# Patient Record
Sex: Male | Born: 1975 | Race: White | Hispanic: No | Marital: Married
Health system: Southern US, Community
[De-identification: ages and names within clinical notes are randomized; demographics above are authoritative.]

---

## 2012-10-04 ENCOUNTER — Inpatient Hospital Stay: Payer: Self-pay | Admitting: Student

## 2012-10-04 LAB — LIPASE, BLOOD: Lipase: >3000 U/L (ref 73–393)

## 2012-10-04 LAB — URINALYSIS, COMPLETE
Bacteria: NONE SEEN
Ketone: NEGATIVE
Leukocyte Esterase: NEGATIVE
Nitrite: NEGATIVE
Ph: 5 (ref 4.5–8.0)
Protein: NEGATIVE
Specific Gravity: 1.014 (ref 1.003–1.030)
Squamous Epithelial: <1

## 2012-10-04 LAB — COMPREHENSIVE METABOLIC PANEL
Bilirubin,Total: 0.3 mg/dL (ref 0.2–1.0)
Co2: 26 mmol/L (ref 21–32)
Creatinine: 1.05 mg/dL (ref 0.60–1.30)
EGFR (African American): >60
EGFR (Non-African Amer.): >60
Glucose: 131 mg/dL — ABNORMAL HIGH (ref 65–99)
Potassium: 3.2 mmol/L — ABNORMAL LOW (ref 3.5–5.1)
Sodium: 140 mmol/L (ref 136–145)

## 2012-10-04 LAB — CBC
HGB: 14.8 g/dL (ref 13.0–18.0)
MCH: 32.6 pg (ref 26.0–34.0)
MCV: 93 fL (ref 80–100)
RBC: 4.55 10*6/uL (ref 4.40–5.90)

## 2012-10-04 LAB — HEPATIC FUNCTION PANEL A (ARMC)
Albumin: 4 g/dL (ref 3.4–5.0)
Alkaline Phosphatase: 64 U/L (ref 50–136)
Bilirubin, Direct: 0.2 mg/dL (ref 0.00–0.20)
Bilirubin,Total: 0.7 mg/dL (ref 0.2–1.0)
Total Protein: 7.5 g/dL (ref 6.4–8.2)

## 2012-10-04 LAB — PROTIME-INR: INR: 1

## 2012-10-05 LAB — COMPREHENSIVE METABOLIC PANEL
Albumin: 3.2 g/dL — ABNORMAL LOW (ref 3.4–5.0)
Alkaline Phosphatase: 50 U/L (ref 50–136)
Bilirubin,Total: 1 mg/dL (ref 0.2–1.0)
Co2: 27 mmol/L (ref 21–32)
Creatinine: 1.06 mg/dL (ref 0.60–1.30)
EGFR (Non-African Amer.): >60
Glucose: 114 mg/dL — ABNORMAL HIGH (ref 65–99)
Osmolality: 276 (ref 275–301)
SGPT (ALT): 72 U/L (ref 12–78)
Total Protein: 6.4 g/dL (ref 6.4–8.2)

## 2012-10-05 LAB — CBC WITH DIFFERENTIAL/PLATELET
Basophil #: 0.1 10*3/uL (ref 0.0–0.1)
Eosinophil #: 0 10*3/uL (ref 0.0–0.7)
Eosinophil %: 0 %
Lymphocyte #: 0.6 10*3/uL — ABNORMAL LOW (ref 1.0–3.6)
Monocyte %: 3.5 %
Neutrophil #: 11.8 10*3/uL — ABNORMAL HIGH (ref 1.4–6.5)
Neutrophil %: 90.9 %
WBC: 13 10*3/uL — ABNORMAL HIGH (ref 3.8–10.6)

## 2012-10-05 LAB — MAGNESIUM: Magnesium: 1.6 mg/dL — ABNORMAL LOW

## 2012-10-05 LAB — LIPASE, BLOOD: Lipase: 2529 U/L — ABNORMAL HIGH (ref 73–393)

## 2012-10-06 LAB — CBC WITH DIFFERENTIAL/PLATELET
Basophil #: 0 10*3/uL (ref 0.0–0.1)
Eosinophil %: 0.3 %
HCT: 39.5 % — ABNORMAL LOW (ref 40.0–52.0)
Lymphocyte #: 1.4 10*3/uL (ref 1.0–3.6)
Lymphocyte %: 11.7 %
MCV: 94 fL (ref 80–100)
Monocyte #: 0.7 x10 3/mm (ref 0.2–1.0)
Monocyte %: 5.4 %
Neutrophil #: 10.1 10*3/uL — ABNORMAL HIGH (ref 1.4–6.5)
Neutrophil %: 82.2 %
Platelet: 185 10*3/uL (ref 150–440)
RBC: 4.19 10*6/uL — ABNORMAL LOW (ref 4.40–5.90)
RDW: 13.5 % (ref 11.5–14.5)
WBC: 12.3 10*3/uL — ABNORMAL HIGH (ref 3.8–10.6)

## 2012-10-06 LAB — BASIC METABOLIC PANEL
BUN: 14 mg/dL (ref 7–18)
Calcium, Total: 7.4 mg/dL — ABNORMAL LOW (ref 8.5–10.1)
Creatinine: 1.05 mg/dL (ref 0.60–1.30)
EGFR (Non-African Amer.): >60
Sodium: 138 mmol/L (ref 136–145)

## 2012-10-07 LAB — BASIC METABOLIC PANEL
Anion Gap: 8 (ref 7–16)
BUN: 10 mg/dL (ref 7–18)
Calcium, Total: 7.6 mg/dL — ABNORMAL LOW (ref 8.5–10.1)
Co2: 23 mmol/L (ref 21–32)
Creatinine: 0.82 mg/dL (ref 0.60–1.30)
EGFR (African American): >60
EGFR (Non-African Amer.): >60
Osmolality: 276 (ref 275–301)
Potassium: 3.7 mmol/L (ref 3.5–5.1)
Sodium: 139 mmol/L (ref 136–145)

## 2012-10-07 LAB — CBC WITH DIFFERENTIAL/PLATELET
Basophil %: 0.3 %
Eosinophil %: 1.7 %
HCT: 35 % — ABNORMAL LOW (ref 40.0–52.0)
HGB: 12.4 g/dL — ABNORMAL LOW (ref 13.0–18.0)
Lymphocyte %: 9.7 %
MCH: 33.2 pg (ref 26.0–34.0)
MCV: 93 fL (ref 80–100)
Monocyte #: 1.1 x10 3/mm — ABNORMAL HIGH (ref 0.2–1.0)
Platelet: 176 10*3/uL (ref 150–440)

## 2012-10-08 LAB — BASIC METABOLIC PANEL
Anion Gap: 7 (ref 7–16)
BUN: 8 mg/dL (ref 7–18)
Calcium, Total: 8.3 mg/dL — ABNORMAL LOW (ref 8.5–10.1)
Chloride: 107 mmol/L (ref 98–107)
Co2: 24 mmol/L (ref 21–32)
Creatinine: 0.85 mg/dL (ref 0.60–1.30)
EGFR (African American): >60
EGFR (Non-African Amer.): >60
Glucose: 82 mg/dL (ref 65–99)
Osmolality: 273 (ref 275–301)
Potassium: 3.8 mmol/L (ref 3.5–5.1)
Sodium: 138 mmol/L (ref 136–145)

## 2012-10-11 LAB — CULTURE, BLOOD (SINGLE)

## 2013-10-18 IMAGING — CT CT ABD-PELV W/O CM
1 of 2 series · 15 of 32 positions shown, 19 images · non-contrast
Comparison: None

REASON FOR EXAM: (1) abd pain vomiting; (2) abd pain vomiting
COMMENTS:

PROCEDURE:     CT  - CT ABDOMEN AND PELVIS W[DATE]  [DATE]
RESULT:     Indication: Abdominal pain and vomiting
TECHNIQUE: Multiple axial images from the lung bases to the symphysis pubis
were obtained without oral and without intravenous contrast.

[Series 2: 3mm soft tissue · axial · 0.74mm/px · z∈[-1036,-526]mm · 15 of 186 slices shown, 19 images]
[im 8/186  soft-tissue]
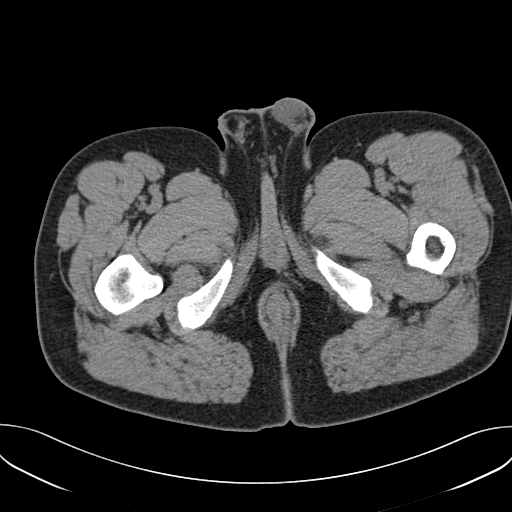
[im 8/186  bone]
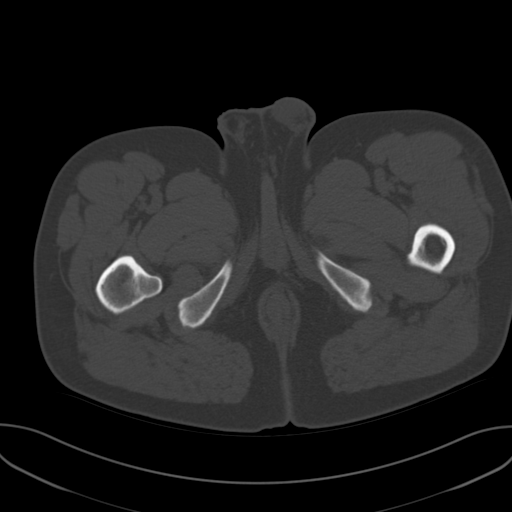
[im 24/186  soft-tissue]
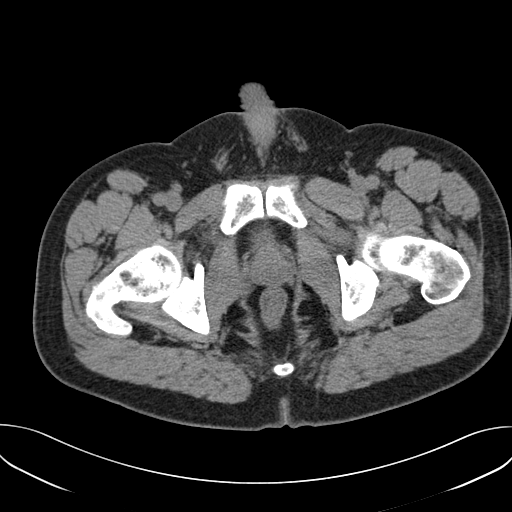
[im 39/186  soft-tissue]
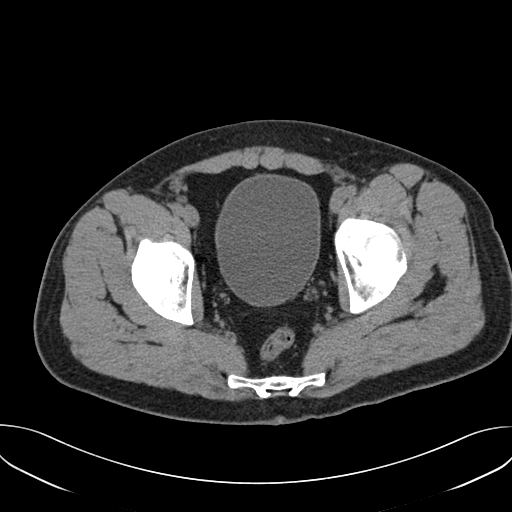
[im 54/186  soft-tissue]
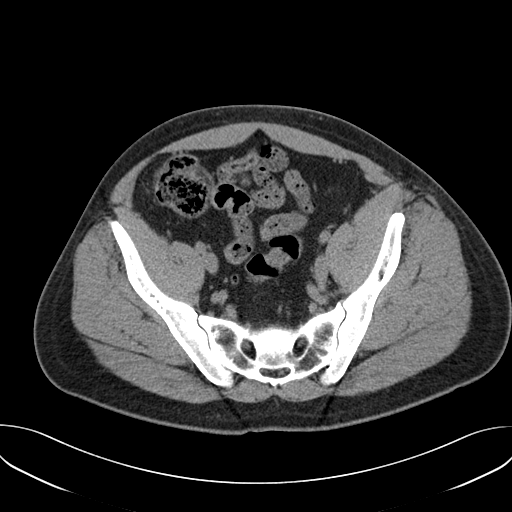
[im 62/186  soft-tissue]
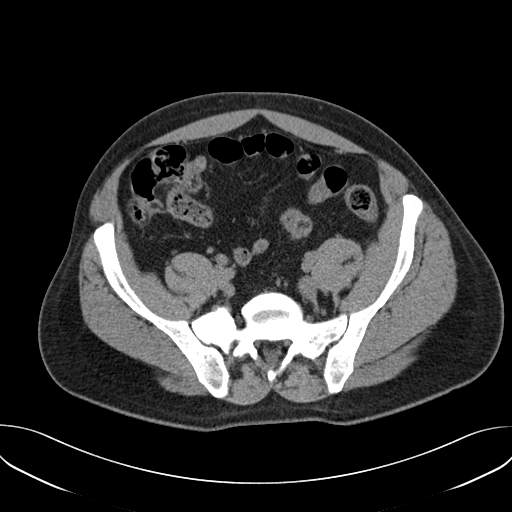
[im 78/186  soft-tissue]
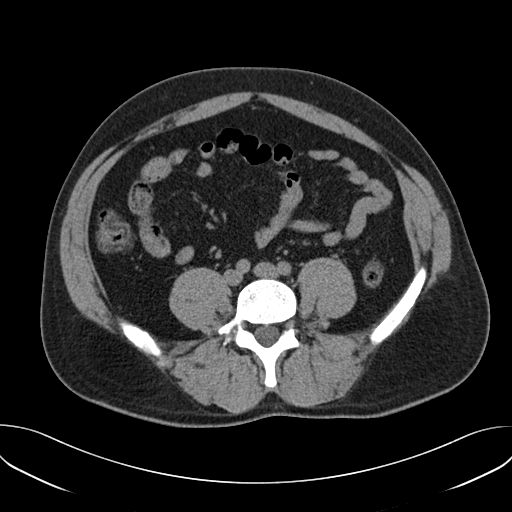
[im 93/186  soft-tissue]
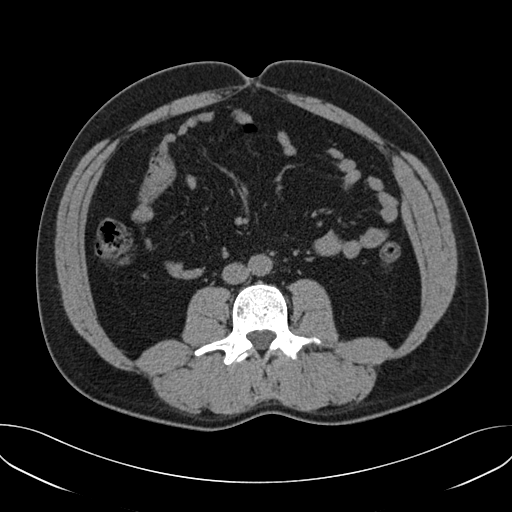
[im 108/186  soft-tissue]
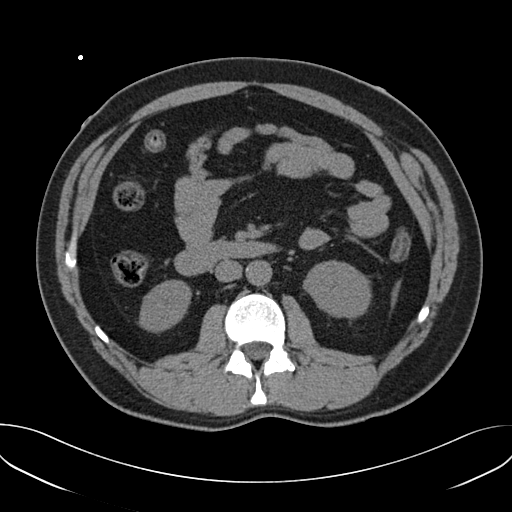
[im 124/186  soft-tissue]
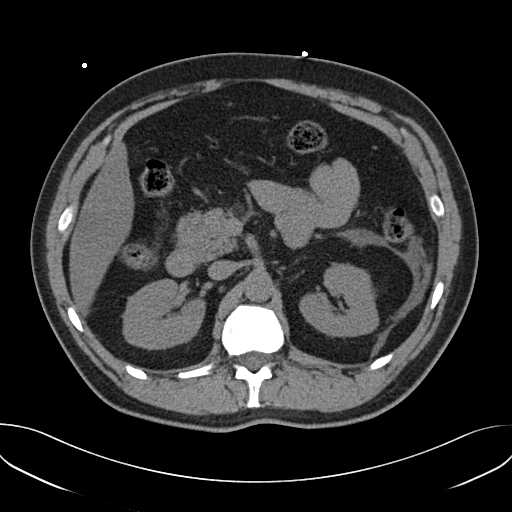
[im 124/186  bone]
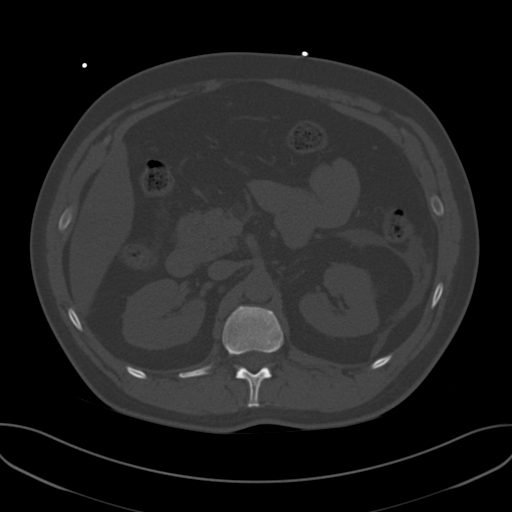
[im 132/186  soft-tissue]
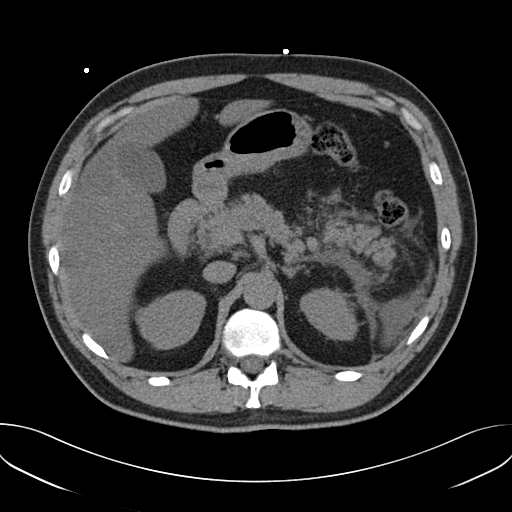
[im 147/186  soft-tissue]
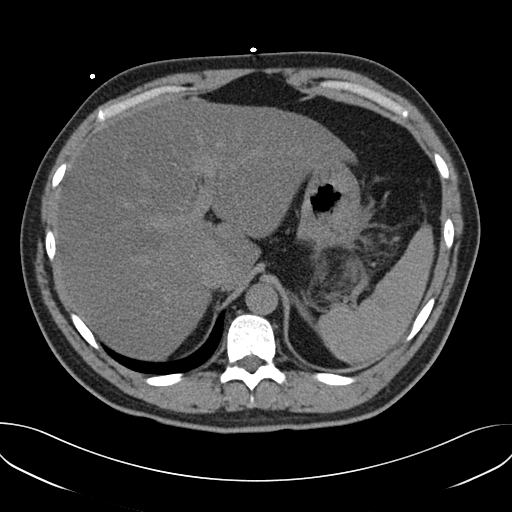
[im 155/186  lung]
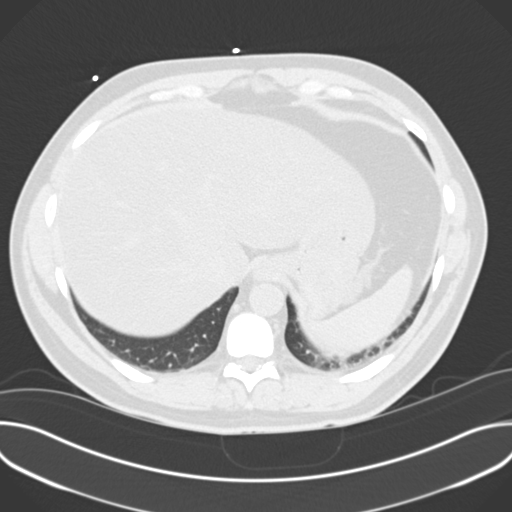
[im 162/186  soft-tissue]
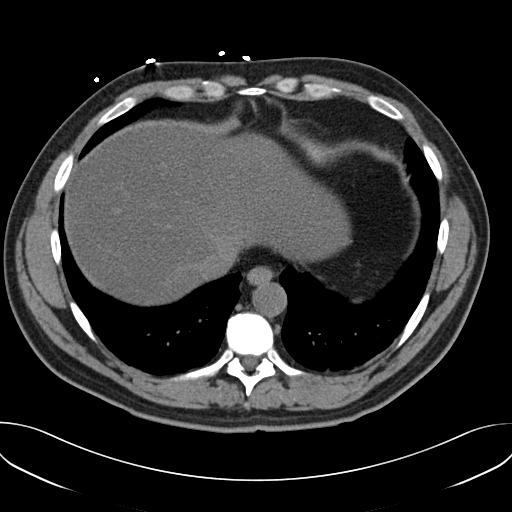
[im 162/186  lung]
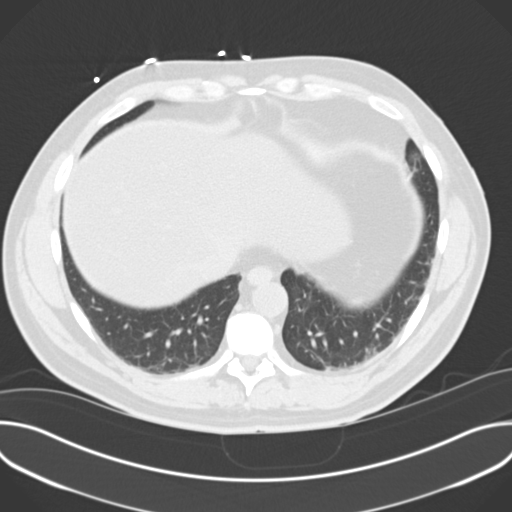
[im 170/186  lung]
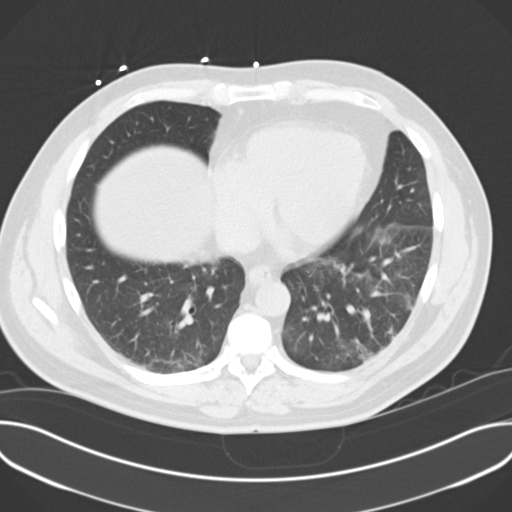
[im 178/186  soft-tissue]
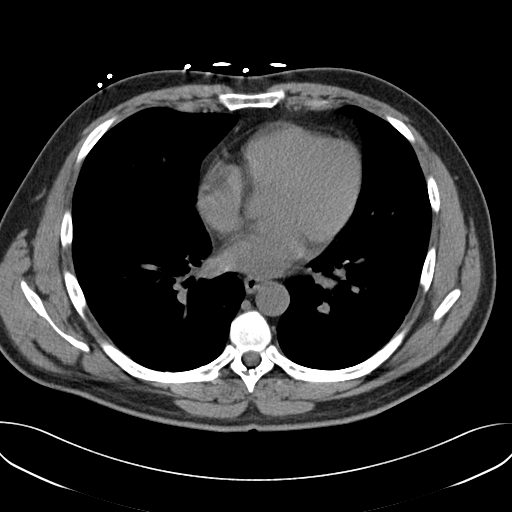
[im 178/186  lung]
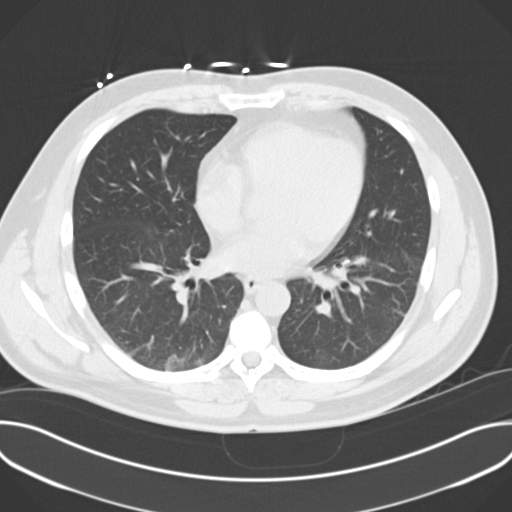

[15 of 32 positions shown; findings below may reference images not displayed]

FINDINGS: The lung bases are clear. There is no pleural or pericardial effusions.

No renal, ureteral, or bladder calculi. No obstructive uropathy. No
perinephric stranding is seen. The kidneys are symmetric in size without
evidence for exophytic mass. The bladder is unremarkable.

The liver is diffusely low in attenuation likely secondary to hepatic
steatosis. The gallbladder is unremarkable. The spleen demonstrates no focal
abnormality. The adrenal glands are normal.

There is peripancreatic inflammatory changes surrounding the pancreatic tail
with a small amount of fluid tracking along the left paracolic gutter. There
is no focal fluid collection.

The unopacified stomach, duodenum, small intestine, and large intestine are
unremarkable, but evaluation is limited by lack of oral contrast. There is a
normal caliber appendix in the right lower quadrant without periappendiceal
inflammatory changes. There is no pneumoperitoneum, pneumatosis, or portal
venous gas. There is no abdominal or pelvic free fluid. There is no
lymphadenopathy.

The abdominal aorta is normal in caliber with atherosclerosis.

The osseous structures are unremarkable.
IMPRESSION: 1. Peripancreatic inflammatory changes around the pancreatic tail most
concerning for acute pancreatitis. There is no focal fluid collection to
suggest a pseudocyst or abscess.

2. Hepatic steatosis.

[REDACTED]

## 2013-10-20 IMAGING — CR DG CHEST 1V PORT
1 series · 1 of 1 positions shown · non-contrast
Comparison: none

REASON FOR EXAM: Decreasing oxygen saturation, cough
COMMENTS:

PROCEDURE:     DXR - DXR PORTABLE CHEST SINGLE VIEW  - October 06, 2012  [DATE]
RESULT:     Comparison: None.

[ap]
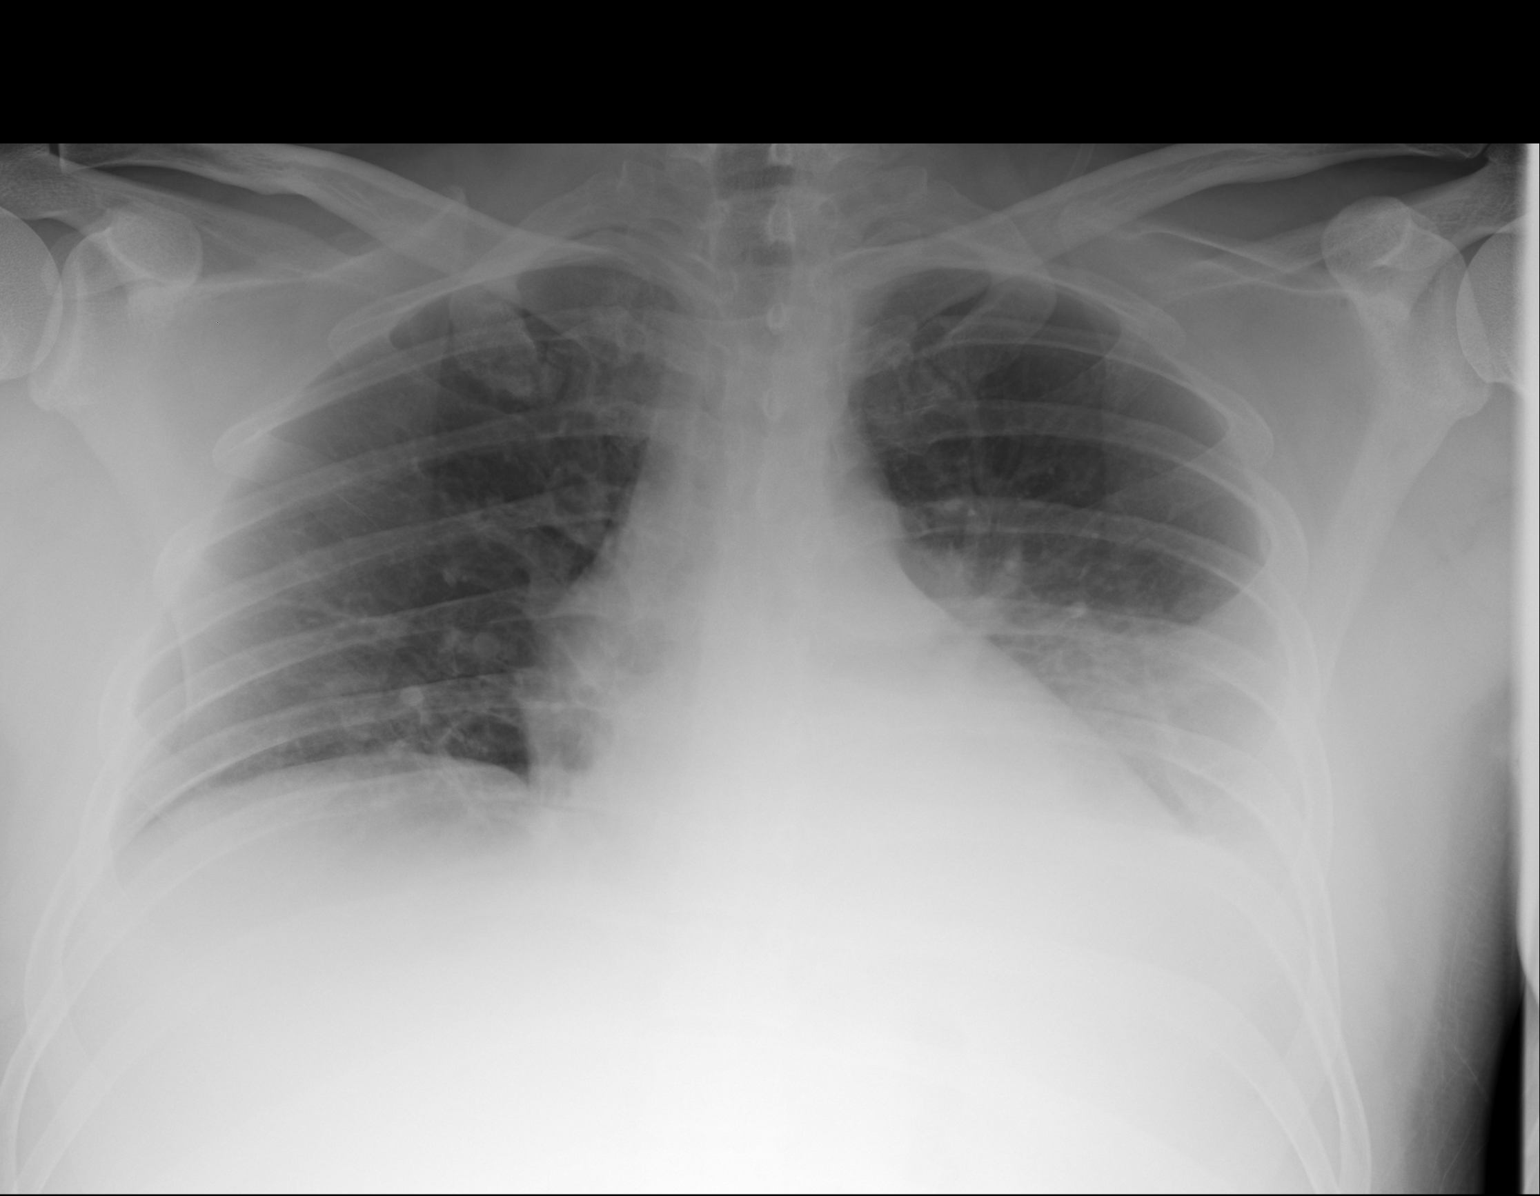

[1 of 1 positions shown; findings below may reference images not displayed]

FINDINGS: The lung volumes are low. Heart size upper limits normal, though this may be
secondary to low lung volumes. There is relative homogeneous opacity in the
left lower lung.
IMPRESSION: Homogeneous opacity in the left lower lung could be secondary to atelectasis
or infection/aspiration. Followup PA and lateral chest radiograph is
suggested when clinically able.

## 2014-09-04 NOTE — Discharge Summary (Signed)
PATIENT NAME:  Darren Richardson, Darren Richardson MR#:  [id number] DATE OF BIRTH:  [birth year]  DATE OF ADMISSION:  10/04/2012 DATE OF DISCHARGE:  10/10/2012  CHIEF COMPLAINT: Abdominal pain and vomiting.   PRIMARY CARE PHYSICIAN: Caroline More, MD  CONSULTANT: Dr. Marva Panda from GI.   DISCHARGE DIAGNOSES: 1.  Acute pancreatitis, likely alcohol induced. 2.  Acute respiratory failure secondary to pneumonia.  3.  Ileus, likely secondary to pancreatitis.  4.  Elevated liver function tests with enlarged liver.  5.  Alcohol abuse.  6.  Hypertension, noncompliance with medications.  7.  Hypomagnesemia.  8.  Hypokalemia.  DISCHARGE MEDICATIONS: 1.  Alprazolam 1 mg 1 tab 3 times a day as needed for anxiety. 2.  Amlodipine 5 mg daily. 3.  Ultram 50 mg every 6 hours as needed.   DIET: Low fat, low cholesterol, bland diet, advance slowly for a few weeks as tolerated.   ACTIVITY: As tolerated.   DISCHARGE FOLLOWUP: Please follow with PCP within 1 to 2 weeks. Please follow with Dr. Marva Panda from GI within 2 to 4 weeks.   DISPOSITION: Home.   SIGNIFICANT LABORATORY AND DIAGNOSTICS: Initial lipase greater than 3000.  Magnesium 1.3. Initial ethanol level was 0.09. Initial potassium 3.2, sodium 140, creatinine 1.05. LFTs: AST was 78, ALT 114. Initial troponin was negative. The last lipase from May 28th was 451.  Initial WBC 13.9.   Initial CT on May 23rd was positive for peripancreatic inflammatory changes around the pancreatic tail most concerning for acute pancreatitis and also hepatic steatosis.  CT of chest, abdomen and pelvis without contrast, on May 25th, showing moderate peripancreatic stranding is increased from prior. Small left and trace right pleural effusions, consolidation and volume loss in the left lower lobe and to a lesser extent in the right lower lobe likely secondary to atelectasis.   HISTORY OF PRESENT ILLNESS AND HOSPITAL COURSE: For full details of H and P, please see the dictation on May 23rd  by Dr. Randol Kern, but briefly this is a pleasant 39 year old male with alcohol abuse and hypertension with significant drinking on a nightly basis of 8 to 12 shots of bourbon who presents with acute pancreatitis. He had been vomiting, had blood tinged vomitus as well, and he was admitted to the hospitalist service for acute pancreatitis with increased lipase and CT showing acute pancreatitis as well. He was admitted to the hospitalist service with a GI consult. He was started on aggressive IV fluid resuscitation, pain meds as well as Zofran and opiates. He was initially kept n.p.o. His diet was advanced slowly. He did have very slow improvement. Pancreatitis was likely alcohol induced. He did develop ileus secondary to pancreatitis as well. He was started on CIWA protocol as well. He remained calm, stable without significant withdrawal symptoms. His blood pressure was labile and elevated, likely accelerated hypertension in the setting of noncompliant with medications, including pain from pancreatitis. His atenolol was changed to amlodipine. His blood pressure did come down. His lytes were repleted as needed. He did have acute respiratory failure secondary to pneumonia with possible atelectasis as well and was treated with Rocephin and azithromycin. The respiratory failure has resolved. He is off of oxygen at this point. He has tolerated advanced diet and he will be discharged to outpatient follow-up.   CODE STATUS: The patient is FULL CODE.   TOTAL TIME SPENT: 35 minutes.  ____________________________ Darren Eaton, MD sa:sb D: 10/11/2012 08:22:27 ET T: 10/11/2012 10:45:04 ET JOB#: [id number]  cc: Darren Eaton, MD, <Dictator> Caroline More,  MD Darren EatonSHAYIQ Encarnacion Scioneaux MD ELECTRONICALLY SIGNED 10/16/2012 22:14

## 2014-09-04 NOTE — Consult Note (Signed)
PATIENT NAME:  Darren Richardson, Darren Richardson MR#:  [id number] DATE OF BIRTH:  10-24-1975  DATE OF CONSULTATION:  10/04/2012  REFERRING PHYSICIAN:  Dr. Randol Kern  CONSULTING PHYSICIAN:  Keturah Barre, NP  REASON FOR CONSULTATION:  Evaluation of alcoholic pancreatitis.   HISTORY OF PRESENT ILLNESS: I appreciate consult for this 39 year old Caucasian man with history of heavy alcohol use for evaluation of pancreatitis. He states this is his first episode of pancreatitis. He states he is feeling better since admission with his pain medicines and rehydration and his pain is down to 8/10, from a 10/10. States no further vomiting and the nausea only happens when the pain is intense.  He is receiving IV rehydration and IV pain medications, proton pump inhibitors. He states he has no other GI complaints right now.  He states he has no history of other GI complaints. Does report a history of flu-like symptoms 3 or 4 weeks ago and treated this at home and seems to have gotten better. I do note some elevation of liver transaminases, but not in alcoholic pattern. States there is no family history of liver disease. History is negative for [employer] health care service, tattoos, piercings, dialysis, history of hepatitis, blood transfusions and IVDU.  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 CAT scan reveals some fatty infiltration of the liver and pancreatitis.   PAST MEDICAL HISTORY: HTN, alcohol abuse.   PAST SURGICAL HISTORY: None.   FAMILY HISTORY: Hypertension. Negative for pancreatic diseases, liver diseases, PUD, colorectal cancer, colon polyps.   SOCIAL HISTORY: He works as a Copywriter, advertising.  Lives at home with his wife and drinks up to 8 shots of bourbon every night. Quit smoking cigarettes in January.   ALLERGIES: No known drug allergies.   HOME MEDICATIONS:  Xanax p.r.n., atenolol but has not been taking this regularly.   REVIEW OF SYSTEMS:  Ten  systems reviewed. These were unremarkable other than what is written above.   LABORATORY DATA:  Most recent labs: Glucose 131, BUN 8, creatinine 1.05, sodium 140, potassium 3.2, GFR greater than 60, magnesium 1.3, calcium 9. Lipase greater than 3000, EtOH 0.09. Total protein 7.5, albumin 4.1, total bilirubin 0.3, ALT 69, AST 78, alkaline phosphatase 114.  WBC 13.9, hemoglobin has been stable with readings of 14.8 and 15.5, hematocrit 42.4, platelet count 372. Red cells are normocytic. CT of the abdomen and pelvis revealed fatty liver and pancreatitis.   PHYSICAL EXAMINATION: VITAL SIGNS: Most recent vital signs: Temperature 98.1, pulse 86, respiratory rate 20, blood pressure 145/98, oxygen saturation 95%.  His CIWA scale has been ranging from 3 to 4 today.   GENERAL: Pleasant Caucasian man resting in bed in no acute distress.   HEENT: Normocephalic, atraumatic. No scleral icterus. No redness, drainage, or erythema to the eyes, nares or mouth.   NECK: Supple. No thyromegaly or JVD.   PULMONARY: Respirations eupneic. Lungs CTAB.   CARDIAC: S1, S2, regular rate and rhythm. No MRG. No appreciable edema. Peripheral pulses 2+.   ABDOMEN: Mildly protuberant. Hypoactive bowel sounds x 4. Soft. Generalized discomfort, more so to the left upper quadrant. No guarding, rigidity, peritoneal signs. No Faythe Casa or Cullen sign.   EXTREMITIES: MAEW x 4. Sensation intact. No clubbing or cyanosis.   NEUROLOGIC: Alert, oriented x 3. Cranial nerves II through XII are grossly intact. Speech clear. No facial droop.   PSYCHIATRIC: Calm, appropriate, intact judgment.   SKIN: Warm, dry, pink. No erythema, lesion  or rash.   IMPRESSION AND PLAN: 1. Pancreatitis.  Strongly recommend alcohol abstinence. The patient and wife are agreeable and plan on this. Continue present with IV fluid rehydration and pain medications. Will repeat lipase. 2. Elevated liver function tests.  Some of this may be related to EtOH, but  this is not typically an alcoholic pattern. We will assess for hepatitis A, B, C and assess PT-INR and repeat liver panel, following with you.   These services were provided by Vevelyn Pathristiane London, MSN, [facility] in collaboration with Barnetta ChapelMartin Skulskie, M.D. with whom I have discussed this patient in full. Thank you very much for this consult    ____________________________ Keturah Barrehristiane H. London, NP   chl:rw D: 10/04/2012 15:31:27 ET T: 10/04/2012 16:58:21 ET JOB#: [id number]  cc: Keturah Barrehristiane H. London, NP, <Dictator> Eustaquio MaizeHRISTIANE H LONDON FNP ELECTRONICALLY SIGNED 10/30/2012 15:58

## 2014-09-04 NOTE — Consult Note (Signed)
Chief Complaint:  Subjective/Chief Complaint seen for etoh related pancreatitis.  continues with abdominla pain and bloating/distension, not passing flatus, pain is about the same as when seen last night.  One episode of emesis this am, but this is better, now no nausea.  Increased abdominal pain while walking   VITAL SIGNS/ANCILLARY NOTES: **Vital Signs.:   24-May-14 12:57  Vital Signs Type Routine  Temperature Temperature (F) 98.4  Celsius 36.8  Temperature Source oral  Pulse Pulse 116  Respirations Respirations 20  Systolic BP Systolic BP 062  Diastolic BP (mmHg) Diastolic BP (mmHg) 99  Mean BP 113  Pulse Ox % Pulse Ox % 91  Pulse Ox Activity Level  At rest  Oxygen Delivery Room Air/ 21 %  *Intake and Output.:   24-May-14 05:41  Grand Totals Intake:   Output:      Net:   24 Hr.:  3762    06:32  Grand Totals Intake:   Output:  300    Net:  -300 24 Hr.:  1129    Shift 07:00  Grand Totals Intake:   Output:  300    Net:  -300 24 Hr.:  8315  Length of Stay Totals Intake:  2379 Output:  1250    Net:  1129    Daily 07:00  Grand Totals Intake:  2379 Output:  1250    Net:  1761 60 Hr.:  7371  Length of Stay Totals Intake:  2379 Output:  1250    Net:  Theba Totals Intake:   Output:  1    Net:  -1 24 Hr.:  -1    12:57  Grand Totals Intake:   Output:      Net:   24 Hr.:  -1    Shift 15:00  Grand Totals Intake:   Output:  1    Net:  -1 24 Hr.:  -1  Length of Stay Totals Intake:  2379 Output:  0626    Net:  9485   Brief Assessment:  Cardiac Regular   Respiratory clear BS   Gastrointestinal details normal distended, slightoy less than yesterday, minimal bowel dsounds, moderate generalized tenderness, mostly left and upper abdomen.   Lab Results: Hepatic:  24-May-14 01:04   Bilirubin, Total 1.0  Alkaline Phosphatase 50  SGPT (ALT) 72  SGOT (AST)  42  Total Protein, Serum 6.4  Albumin, Serum  3.2  General Ref:  23-May-14 14:27    Hepatitis A Antibody, Total ========== TEST NAME ==========  ========= RESULTS =========  = REFERENCE RANGE =  HEPATITIS A ANTIBDY,TOTL  Hep A Ab, Total Hep A Ab, Total                 [   Negative             ]          Negative               [facility]   No: [id number]           [address] [place],  [address]           Lindon Romp, MD         [phone]   Result(s) reported on 05 Oct 2012 at 04:49AM.  Hepatitis B Surface Antibody, Qual ========== TEST NAME ==========  ========= RESULTS =========  = REFERENCE RANGE =  HEPATITIS B SURF.AB,QUAL  Hep B Surface Ab Hep B Surface Ab, Qual          [  Non Reactive         ]                                                Non Reactive: Inconsistent with immunity,                                            less than 10 mIU/mL                              Reactive:     Consistent with immunity,                                            greater than 9.9 mIU/mL               [facility]            No: [id number]           [address], [place], [place] [address]           Lindon Romp, MD         [phone]   Result(s) reported on 05 Oct 2012 at 04:49AM.  HBsAg ========== TEST NAME ==========  ========= RESULTS =========  = REFERENCE RANGE =  HEPATITIS B SURFACE AG  HBsAg Screen HBsAg Screen                    [   Negative             ]          Negative               [facility]            No: [id number]           [address], [place], [place] [address]           Lindon Romp, MD         [phone]   Result(s) reported on 05 Oct 2012 at 04:49AM.  Hepatitis C Virus Antibody ========== TEST NAME ==========  ========= RESULTS =========  = REFERENCE RANGE =  HCV ANTIBODY  HCV Antibody Hep C Virus Ab                  [   <0.1 s/co ratio      ]           0.0-0.9                                                  Negative:     < 0.8                                               Indeterminate 0.8 - 0.9  Positive:     > 0.9                                                                      .                  In order to reduce the incidence of a false positive                  result, the CDC recommends that all s/co ratios                  between 1.0 and 10.9 be confirmed by a more specific                  supplemental or PCR testing. [facility] offers HCV Ab                 w/Reflex to Verification test [phone].               [facility]            No: [id number]           [address] [place], [place] [address]           Lindon Romp, MD         [phone]   Result(s) reportedon 05 Oct 2012 at 04:49AM.  Routine Chem:  23-May-14 01:34   Creatinine (comp) 1.05  24-May-14 01:04   Lipase  > 3000 (Result(s) reported on 05 Oct 2012 at 01:33AM.)  Result Comment HGB - DUPLICATE ORDER. CBC ORDERED AS WELL  Result(s) reported on 05 Oct 2012 at 01:24AM.  Result Comment CBC - SMEAR SCANNED  Result(s) reported on 05 Oct 2012 at 02:11AM.  Magnesium, Serum  1.6 (1.8-2.4 THERAPEUTIC RANGE: 4-7 mg/dL TOXIC: > 10 mg/dL  -----------------------)  Glucose, Serum  114  BUN 12  Creatinine (comp) 1.06  Sodium, Serum 138  Potassium, Serum 4.4  Chloride, Serum 106  Calcium (Total), Serum  8.0  Osmolality (calc) 276  eGFR (African American) >60  eGFR (Non-African American) >60 (eGFR values <55m/min/1.73 m2 may be an indication of chronic kidney disease (CKD). Calculated eGFR is useful in patients with stable renal function. The eGFR calculation will not be reliable in acutely ill patients when serum creatinine is changing rapidly. It is not useful in  patients on dialysis. The eGFR calculation may not be applicable to patients at the low and high extremes of body sizes, pregnant women, and vegetarians.)  Anion Gap  5  Routine Coag:  23-May-14 14:27   INR 1.0 (INR reference interval  applies to patients on anticoagulant therapy. A single INR therapeutic range for coumarins is not optimal for all indications; however, the suggested range for most indications is 2.0 - 3.0. Exceptions to the INR Reference Range may include: Prosthetic heart valves, acute myocardial infarction, prevention of myocardial infarction, and combinations of aspirin and anticoagulant. The need for a higher or lower target INR must be assessed individually. Reference: The Pharmacology and Management of the Vitamin K  antagonists: the seventh ACCP Conference on Antithrombotic and Thrombolytic Therapy. CVVZSM.2707Sept:126 (3suppl): [id number] A HCT value >55% may artifactually increase the PT.  In one study,  the increase  was an average of 25%. Reference:  "Effect on Routine and Special Coagulation Testing Values of Citrate Anticoagulant Adjustment in Patients with High HCT Values." American Journal of Clinical Pathology 2006;126:400-405.)  Routine Hem:  23-May-14 01:34   WBC (CBC)  13.9  Platelet Count (CBC) 372 (Result(s) reported on 04 Oct 2012 at 02:03AM.)  24-May-14 01:04   Hemoglobin (CBC) -  Hemoglobin (CBC) 15.9  WBC (CBC)  13.0  RBC (CBC) 4.89  Hematocrit (CBC) 45.9  Platelet Count (CBC) 259  MCV 94  MCH 32.6  MCHC 34.7  RDW 12.9  Neutrophil % 90.9  Lymphocyte % 4.9  Monocyte % 3.5  Eosinophil % 0.0  Basophil % 0.7  Neutrophil #  11.8  Lymphocyte #  0.6  Monocyte # 0.5  Eosinophil # 0.0  Basophil # 0.1   Radiology Results: CT:    23-May-14 02:42, CT Abdomen and Pelvis Without Contrast  CT Abdomen and Pelvis Without Contrast   REASON FOR EXAM:    (1) abd pain vomiting; (2) abd pain vomiting  COMMENTS:       PROCEDURE: CT  - CT ABDOMEN AND PELVIS W0  - Oct 04 2012  2:42AM     RESULT: Indication: Abdominal pain and vomiting    Comparison: None    Technique: Multiple axial images from the lung bases to the symphysis   pubis were obtained without oral and without  intravenous contrast.    Findings:    The lung bases are clear. There is no pleural or pericardial effusions.  No renal, ureteral, or bladder calculi. No obstructive uropathy. No   perinephric stranding is seen. The kidneys are symmetric in size without   evidence for exophytic mass. The bladder is unremarkable.    The liver is diffusely low in attenuation likely secondary to hepatic   steatosis. The gallbladder is unremarkable. The spleen demonstrates no   focal abnormality. The adrenal glands are normal.     There is peripancreatic inflammatory changes surrounding the pancreatic   tail with a small amount of fluid tracking along the left paracolic   gutter. There is no focal fluid collection.     The unopacified stomach, duodenum, small intestine, and large intestine   are unremarkable, but evaluation is limited by lack of oral contrast.   There is a normal caliber appendix in the right lower quadrant without     periappendiceal inflammatory changes. There is no pneumoperitoneum,   pneumatosis, or portal venous gas. There is no abdominal or pelvic free   fluid. There is no lymphadenopathy.     The abdominal aorta is normal in caliber with atherosclerosis.    The osseous structures are unremarkable.    IMPRESSION:     1. Peripancreatic inflammatory changes around the pancreatic tail most   concerning for acute pancreatitis. There is no focal fluid collection to   suggest a pseudocyst or abscess.    2. Hepatic steatosis.  Dictation Site: 1        Verified By: Jennette Banker, M.D., MD   Assessment/Plan:  Assessment/Plan:  Assessment 1) acute pancreatitis,likely etoh related.  labs improving/stable except lipase still elevated.  patietn still with significant sx and still without passing flatus, poor bowel sounds.   Plan 1) pain control as needed, maintain adequate hydration, will recheck lipase the pm.  following   Electronic Signatures: Loistine Simas (MD)  (Signed  24-May-14 15:00)  Authored: Chief Complaint, VITAL SIGNS/ANCILLARY NOTES, Brief Assessment, Lab Results, Radiology Results, Assessment/Plan   Last  Updated: 24-May-14 15:00 by Loistine Simas (MD)

## 2014-09-04 NOTE — Consult Note (Signed)
Brief Consult Note: Diagnosis: pancreatitis.   Patient was seen by consultant.   Consult note dictated.   Comments: Appreciate consult for [age] y /o caucasian man with history of heavy etoh use, for evaluation of pancreatitis. States this is his first episode of pancreatitis.  States that he is feeling some better since admission with the pain medications, pain is down to 8/10 from 10/10. No further vomiting, nausea only happens when pain is intense. Is receiving IV rehydration and pain meds, PPI.  States that he has no other GI complaints.  Do note some elevation of liver transaminases, but not in alcoholic pattern. States that there is no family history of liver disease, hx neg for military/health care service, tattoos, piercings, dialysis, hx hepatitis, blood transfusion, and IVDU. Does state that remotely he had used some intranasal cocaine. CT revealed some fatty infiltration of the liver and pancreatitis Impression and plan: 1. Pancreatitis: strongly recommend etoh abstinence- patient and wife agreeable and plan on this. Continue present with IVF and pain meds. Will repeat lipase                                2. Elevated LFTs: some of this may be related to etoh, but this is not a typically alcoholic pattern, Will assess for hepatitis a, b, c., assess pt/inr and repeat liver panel. Following.  Electronic Signatures: Vevelyn PatLondon, Janda Cargo H (NP)  (Signed 23-May-14 15:25)  Authored: Brief Consult Note   Last Updated: 23-May-14 15:25 by Keturah BarreLondon, Brason Berthelot H (NP)

## 2014-09-04 NOTE — Consult Note (Signed)
Chief Complaint:  Subjective/Chief Complaint seen for pancreatitis-feeling much beeter.  no n/v, tolerating clears.  still with some moderate to mild abdominal distension.  pain 5-10 at times. passing flatus.   VITAL SIGNS/ANCILLARY NOTES: **Vital Signs.:   27-May-14 13:48  Vital Signs Type Routine  Temperature Temperature (F) 98.2  Celsius 36.7  Temperature Source oral  Pulse Pulse 100  Respirations Respirations 20  Systolic BP Systolic BP 579  Diastolic BP (mmHg) Diastolic BP (mmHg) 87  Mean BP 110  Pulse Ox % Pulse Ox % 96  Pulse Ox Activity Level  At rest  Oxygen Delivery Room Air/ 21 %   Brief Assessment:  Cardiac Regular   Respiratory clear BS   Gastrointestinal details normal distended, positive bowel sounds, mild tenderness epigastrum andluq.   Lab Results: Routine Chem:  27-May-14 08:13   Glucose, Serum 82  BUN 8  Sodium, Serum 138  Potassium, Serum 3.8  Chloride, Serum 107  CO2, Serum 24  Calcium (Total), Serum  8.3  Anion Gap 7  Osmolality (calc) 273  eGFR (African American) >60  eGFR (Non-African American) >60 (eGFR values <46m/min/1.73 m2 may be an indication of chronic kidney disease (CKD). Calculated eGFR is useful in patients with stable renal function. The eGFR calculation will not be reliable in acutely ill patients when serum creatinine is changing rapidly. It is not useful in  patients on dialysis. The eGFR calculation may not be applicable to patients at the low and high extremes of body sizes, pregnant women, and vegetarians.)  Lipase  463 (Result(s) reported on 08 Oct 2012 at 08:46AM.)   Assessment/Plan:  Assessment/Plan:  Assessment 1) pancreatitis, etoh related, slow resolution.  would continue to advance diet slowly to full liquids before low residue.  patietn still not passing stool, but is passing flatus, still some distension.   Plan 1) as above.   Electronic Signatures: SLoistine Simas(MD)  (Signed 27-May-14  17:20)  Authored: Chief Complaint, VITAL SIGNS/ANCILLARY NOTES, Brief Assessment, Lab Results, Assessment/Plan   Last Updated: 27-May-14 17:20 by SLoistine Simas(MD)

## 2014-09-04 NOTE — Consult Note (Signed)
Chief Complaint:  Subjective/Chief Complaint seen for pancreatitis.  resting pain 2-3/10, walking pain 6-7/10, both improved, still not passing flatus.  less nausea, no emesis.   VITAL SIGNS/ANCILLARY NOTES: **Vital Signs.:   25-May-14 13:50  Vital Signs Type Routine  Temperature Temperature (F) 99.9  Celsius 37.7  Temperature Source oral  Pulse Pulse 107  Respirations Respirations 20  Systolic BP Systolic BP 244  Diastolic BP (mmHg) Diastolic BP (mmHg) 89  Mean BP 108  Pulse Ox % Pulse Ox % 96  Pulse Ox Activity Level  At rest  Oxygen Delivery 2L  *Intake and Output.:   Daily 25-May-14 07:00  Emesis ml     Out:  1   Brief Assessment:  Cardiac Regular   Respiratory clear BS   Gastrointestinal details normal No rigidity  distended, bowel sounds positive, ocasionally high pitch.  diffuse abdominal discomfort, mostly epigstric.   Lab Results: Routine Chem:  23-May-14 01:34   Lipase  > 3000 (Result(s) reported on 04 Oct 2012 at 02:17AM.)    14:27   Lipase  > 3000 (Result(s) reported on 04 Oct 2012 at 03:31PM.)  24-May-14 01:04   Lipase  > 3000 (Result(s) reported on 05 Oct 2012 at 01:33AM.)    16:23   Lipase  2529 (Result(s) reported on 05 Oct 2012 at 04:53PM.)  25-May-14 04:54   Glucose, Serum 98  BUN 14  Creatinine (comp) 1.05  Sodium, Serum 138  Potassium, Serum 4.1  Chloride, Serum 107  CO2, Serum 26  Calcium (Total), Serum  7.4  Anion Gap  5  Osmolality (calc) 276  eGFR (African American) >60  eGFR (Non-African American) >60 (eGFR values <68m/min/1.73 m2 may be an indication of chronic kidney disease (CKD). Calculated eGFR is useful in patients with stable renal function. The eGFR calculation will not be reliable in acutely ill patients when serum creatinine is changing rapidly. It is not useful in  patients on dialysis. The eGFR calculation may not be applicable to patients at the low and high extremes of body sizes, pregnant women, and vegetarians.)   Lipase  1230 (Result(s) reported on 06 Oct 2012 at 05:33AM.)  Routine Hem:  25-May-14 04:54   WBC (CBC)  12.3  RBC (CBC)  4.19  Hemoglobin (CBC) 13.9  Hematocrit (CBC)  39.5  Platelet Count (CBC) 185  MCV 94  MCH 33.3  MCHC 35.3  RDW 13.5  Neutrophil % 82.2  Lymphocyte % 11.7  Monocyte % 5.4  Eosinophil % 0.3  Basophil % 0.4  Neutrophil #  10.1  Lymphocyte # 1.4  Monocyte # 0.7  Eosinophil # 0.0  Basophil # 0.0 (Result(s) reported on 06 Oct 2012 at 05:27AM.)   Radiology Results: CT:    25-May-14 08:04, CT Chest Abdomen and Pelvis WO  CT Chest Abdomen and Pelvis WO   REASON FOR EXAM:    (1) hypoxia; (2) pancreatitis  COMMENTS:       PROCEDURE: CT  - CT CHEST ABDOMEN AND PELVIS WO  - Oct 06 2012  8:04AM     RESULT: Comparison: CT of the abdomen and pelvis 10/04/2012    Technique: Multiple axial images obtained fromthe thoracic inlet to the   pubic symphysis, without p.o. contrast and without intravenous contrast.    Findings:  No mediastinal, hilar or, or axillary lymphadenopathy. There is a small   left pleural effusion and trace right pleural effusion. Consolidation and   volume loss in the left lower lobe, and to a lesser extent the right  lower lobe, are likely secondary to atelectasis.  The liver is diffusely low in attenuation, consistent with hepatic   steatosis. The spleen and adrenals are unremarkable. Relative increased   attenuation within the gallbladder may represent sludge. There is   moderate peripancreatic stranding, greatest along the body and tail of   the pancreas which is increased from prior. Mild to moderate fluid is   seen extending inferiorly in both the right and left hemiabdomen. No   discrete loculated fluid collection. There is a small amount of   perihepatic and perisplenic fluid.    No renal calculi or hydronephrosis. There is a mild amount of free fluid   in the pelvis. This is new from prior. There is some increased   attenuation  dependently within the fluid which may represent blood   products. The small and large bowel are normal in caliber. The appendix   is normal.  No aggressive lytic or sclerotic osseous lesions are identified.    IMPRESSION:   1. Moderate peripancreatic stranding is increased from prior. Small   amount of fluid within in the abdomen and pelvis is increased from prior.   Relative increased attenuation dependently within the fluid within the   pelvis may represent blood products.  2. Small left, and trace right, pleural effusions. Consolidation and   volume loss in the left lower lobe, and to a lesser extent the right   lower lobe, likely secondary to atelectasis.      Dictation Site: 8      Verified By: Gregor Hams, M.D., MD   Assessment/Plan:  Assessment/Plan:  Assessment 1) acute etoh related pancreatitis-slow clinical improvement, labs improving.  episode of shortness of breath noted, ct noted, some amount of 3rd spacing likely.   2) h/o etoh abuse.   Plan 1) continue current, ice chips ok, daily labs.  following.   Electronic Signatures: Loistine Simas (MD)  (Signed 25-May-14 15:59)  Authored: Chief Complaint, VITAL SIGNS/ANCILLARY NOTES, Brief Assessment, Lab Results, Radiology Results, Assessment/Plan   Last Updated: 25-May-14 15:59 by Loistine Simas (MD)

## 2014-09-04 NOTE — Consult Note (Signed)
Chief Complaint:  Subjective/Chief Complaint seen for pancreatitis.  continues to improve, 2 bm today, passing flatus. less abdominal pain. tolerating clears.   VITAL SIGNS/ANCILLARY NOTES: **Vital Signs.:   28-May-14 13:08  Vital Signs Type Routine  Temperature Temperature (F) 98.3  Celsius 36.8  Temperature Source oral  Pulse Pulse 93  Respirations Respirations 20  Systolic BP Systolic BP 151  Diastolic BP (mmHg) Diastolic BP (mmHg) 88  Mean BP 109  Pulse Ox % Pulse Ox % 97  Pulse Ox Activity Level  At rest  Oxygen Delivery Room Air/ 21 %  *Intake and Output.:   28-May-14 13:01  Stool  Large formed BM   Brief Assessment:  Cardiac Regular   Respiratory clear BS   Gastrointestinal details normal Bowel sounds normal  mild distension, mild to moderate epigastric pain to palpation-improving.   Lab Results:  Routine Chem:  23-May-14 01:34   Lipase  > 3000 (Result(s) reported on 04 Oct 2012 at 02:17AM.)    14:27   Lipase  > 3000 (Result(s) reported on 04 Oct 2012 at 03:31PM.)  24-May-14 01:04   Lipase  > 3000 (Result(s) reported on 05 Oct 2012 at 01:33AM.)    16:23   Lipase  2529 (Result(s) reported on 05 Oct 2012 at 04:53PM.)  25-May-14 04:54   Lipase  1230 (Result(s) reported on 06 Oct 2012 at 05:33AM.)  26-May-14 05:06   Lipase  594 (Result(s) reported on 07 Oct 2012 at 05:37AM.)  27-May-14 08:13   Lipase  463 (Result(s) reported on 08 Oct 2012 at 08:46AM.)  28-May-14 04:52   Lipase  451 (Result(s) reported on 09 Oct 2012 at 06:24AM.)   Assessment/Plan:  Assessment/Plan:  Assessment 1) pancreatitis-etoh related-improving. 2) h/o etoh abuse   Plan 1) advance diet to full liquids-->low res 2) counselled etoh abstinence.  likely home tomorrow.   Electronic Signatures: Barnetta ChapelSkulskie, Shifra Swartzentruber (MD)  (Signed 28-May-14 16:44)  Authored: Chief Complaint, VITAL SIGNS/ANCILLARY NOTES, Brief Assessment, Lab Results, Assessment/Plan   Last Updated: 28-May-14 16:44 by  Barnetta ChapelSkulskie, Caldwell Kronenberger (MD)

## 2014-09-04 NOTE — Consult Note (Signed)
Chief Complaint:  Subjective/Chief Complaint Patient seen and examined, chart reviewed, please see full GI consult.  Patient admitted with abdominal pain and n/v.  Currently pain is 7/10, not passing flatus.  Lab results noted.  Continue current with iv pain control, ivf, would increase to 175 ml per hour. NPO.  Once he is passing flatus needing less pain meds, may trial non-carbonated clears. Please see full GI consult.  Following.   VITAL SIGNS/ANCILLARY NOTES: **Vital Signs.:   23-May-14 13:48  Vital Signs Type Routine  Temperature Temperature (F) 98.1  Celsius 36.7  Temperature Source oral  Pulse Pulse 86  Respirations Respirations 20  Systolic BP Systolic BP 145  Diastolic BP (mmHg) Diastolic BP (mmHg) 98  Mean BP 113  Pulse Ox % Pulse Ox % 95  Pulse Ox Activity Level  At rest  Oxygen Delivery Room Air/ 21 %   Electronic Signatures: Barnetta ChapelSkulskie, Martin (MD)  (Signed 23-May-14 21:27)  Authored: Chief Complaint, VITAL SIGNS/ANCILLARY NOTES   Last Updated: 23-May-14 21:27 by Barnetta ChapelSkulskie, Martin (MD)

## 2014-09-04 NOTE — H&P (Signed)
PATIENT NAME:  Darren Richardson, DOO MR#:  [id number] DATE OF BIRTH:  23-Apr-1976  DATE OF ADMISSION:  10/04/2012  REFERRING PHYSICIAN: Dr. Marjean Donna.   PRIMARY CARE PHYSICIAN: Dr. Suzy Bouchard.   CHIEF COMPLAINT: Abdominal pain, blood-tinged vomiting.   HISTORY OF PRESENT ILLNESS: This is a 39 year old male with significant past medical history of hypertension and heavy alcohol abuse. Drinks 8 to 12 shots of bourbon on a nightly basis. He presents with complaints of abdominal pain and vomiting. The patient reports his abdominal pain started at midnight, 3 hours prior to presentation, in the epigastric area, nonradiating. It was accompanied by nausea and vomiting. He reports having 3 episodes of vomiting. Had blood-tinged vomiting with no significant amount of blood, which he did have the vomiting bag next to him which I looked at. There was only a very, very minimal amount of blood in it. The patient had elevated lipase level at 3000. Denies any previous history of alcoholic pancreatitis in the past. The patient had mildly elevated LFTs. Had CT abdomen and pelvis which did show evidence of acute pancreatitis in the tail of the pancreas and enlarged fatty liver. The patient denies any fever or chills. Had mild leukocytosis at 13.9. As well, he had low magnesium level at 1.3, and he had elevated alcohol level at 0.090. Hospitalist service was requested to admit the patient for further management and workup of his acute pancreatitis. The patient reports he has a history of remote Advil use before 2 weeks where he was using up to 800 mg every 8 hours but nothing for the last 2 weeks.   PAST MEDICAL HISTORY:  1. Hypertension.  2. Alcohol abuse.   PAST SURGICAL HISTORY: None.   FAMILY HISTORY: Significant for hypertension.   SOCIAL HISTORY: The patient works as a Research officer, [employer]. Lives at home with his wife. History of heavy alcohol abuse. Drinks up to 12 shots of bourbon at least every night. He  quit smoking in January of this year. He smoked 8 cigarettes since then.   ALLERGIES: No known drug allergies.   HOME MEDICATIONS:  1. Xanax as needed.  2. Atenolol, but he reports he has not been taking it regularly.   REVIEW OF SYSTEMS:  CONSTITUTIONAL: Denies fever, chills, weight gain, weight loss.  EYES: Denies blurry vision, double vision, pain, inflammation.  ENT: Denies tinnitus, ear pain, hearing loss, epistaxis or discharge.  RESPIRATORY: Denies cough, wheezing, hemoptysis, dyspnea.  CARDIOVASCULAR: Denies chest pain, edema, arrhythmia, palpitations, syncope.  GASTROINTESTINAL: Complains of nausea, vomiting, abdominal pain with blood-tinged. Denies any significant hematemesis or coffee-ground emesis or melena or bright red blood per rectum or diarrhea or constipation.  GENITOURINARY: Denies dysuria, hematuria, renal colic.  ENDOCRINE: Denies polyuria, polydipsia, heat or cold intolerance.  HEMATOLOGY: Denies anemia, easy bruising, bleeding diathesis.  INTEGUMENTARY: Denies acne, rash or skin lesions.  MUSCULOSKELETAL: Denies neck pain, shoulder pain, back pain, arthritis or gout.  NEUROLOGIC: Denies CVA, TIA, ataxia, vertigo, tremors.  PSYCHIATRIC: Denies any insomnia, bipolar disorder. Has history of alcohol abuse and anxiety.   PHYSICAL EXAM:  VITAL SIGNS: Temperature 97.9, pulse 103, respiratory 22. Blood pressure 202/105, last one was 165/85. Saturating 96% on room air.  GENERAL: Well-nourished male who looks comfortable and in no apparent distress.  HEENT: Head atraumatic, normocephalic. Pupils equal, reactive to light. Pink conjunctivae. Anicteric sclerae. Moist oral mucosa.  NECK: Supple. No thyromegaly. No JVD.  CHEST: Good air entry bilaterally. No wheezing, rales, rhonchi.  CARDIOVASCULAR: S1, S2 heard. No rubs,  murmurs, gallops.  ABDOMEN: Epigastric tenderness but no rebound, no guarding. Bowel sounds present.  EXTREMITIES: No edema. No clubbing. No cyanosis.  Dorsalis pedis pulse +2.  PSYCHIATRIC: Appropriate affect. Awake, alert x3. Intact judgment and insight.  NEUROLOGIC: Cranial nerves grossly intact. Motor 5 out of 5. No focal or sensory deficits.  SKIN: Normal skin turgor. Warm and dry.  LYMPHATIC: No cervical lymphadenopathy.   LABORATORY: Glucose 131, BUN 8, creatinine 1.05, sodium 140, potassium 3.2, chloride 105, CO2 26. Lipase more than 3000. Magnesium 1.3. Ethanol 0.090. AST 78, ALT 114, alk phos 69. Troponin less than 0.02. White blood cell 15.9, hemoglobin 14.8, hematocrit 42.4, platelets 372,000.   EKG showing sinus tachycardia with incomplete right bundle branch block.   CT abdomen and pelvis showing acute pancreatitis predominantly involving the tail of the pancreas and hepatomegaly with diffuse fatty infiltration.   ASSESSMENT AND PLAN:  1. Acute pancreatitis: This is most likely alcohol induced as the patient has a history of heavy alcohol abuse. The patient will be kept n.p.o., on aggressive intravenous fluid hydration, and given the fact he is afebrile, there is no indication for intravenous antibiotics at this point. He will be kept n.p.o. on fluids, on p.r.n. pain and nausea medication. Will consult gastroenterology.  2. Elevated LFTs and enlarged liver on the CAT scan: This is most likely related to his alcohol abuse. Will monitor closely.  3. Alcohol abuse: The patient was counseled at length. Will be started on CIWA protocol.  4. Hypomagnesemia: Will replace.  5. Hypokalemia: Will replace.  6. Blood-tinged vomiting: This is gastritis versus due to his vomiting. Will be on intravenous Protonix. Will check his hemoglobin and hematocrit every 8 hours. Will consult gastroenterology.  7. Hypertension: Will continue the patient on atenolol.  8. Deep vein thrombosis prophylaxis: Sequential compression device. Will try to avoid chemical anticoagulation secondary to his blood-tinged vomiting.   CODE STATUS: FULL CODE.   TOTAL TIME  SPENT ON ADMISSION AND PATIENT CARE: 60 minutes.   ____________________________ Albertine Patricia, MD dse:gb D: 10/04/2012 04:02:01 ET T: 10/04/2012 04:24:29 ET JOB#: [id number]  cc: Albertine Patricia, MD, <Dictator> DAWOOD Graciela Husbands MD ELECTRONICALLY SIGNED 10/05/2012 4:27

## 2014-09-04 NOTE — Consult Note (Signed)
Chief Complaint:  Subjective/Chief Complaint seen for pancreatitis-abdominalpain stiol present at time, though improved, minimal n, no v, tolerating regular diet.   VITAL SIGNS/ANCILLARY NOTES: **Vital Signs.:   29-May-14 08:40  Temperature Source oral  Pulse Pulse 96   Brief Assessment:  Cardiac Regular   Respiratory clear BS   Gastrointestinal details normal Soft  Nondistended  No masses palpable  Bowel sounds normal  minimal tenderness   Lab Results: Routine Chem:  23-May-14 01:34   Lipase  > 3000 (Result(s) reported on 04 Oct 2012 at 02:17AM.)    14:27   Lipase  > 3000 (Result(s) reported on 04 Oct 2012 at 03:31PM.)  24-May-14 01:04   Lipase  > 3000 (Result(s) reported on 05 Oct 2012 at 01:33AM.)    16:23   Lipase  2529 (Result(s) reported on 05 Oct 2012 at 04:53PM.)  25-May-14 04:54   Lipase  1230 (Result(s) reported on 06 Oct 2012 at 05:33AM.)  26-May-14 05:06   Lipase  594 (Result(s) reported on 07 Oct 2012 at 05:37AM.)  27-May-14 08:13   Lipase  463 (Result(s) reported on 08 Oct 2012 at 08:46AM.)  28-May-14 04:52   Lipase  451 (Result(s) reported on 09 Oct 2012 at 06:24AM.)   Assessment/Plan:  Assessment/Plan:  Assessment 1) pancreatitis-etoh related.  much improved, tolerating regular/low fat diet.  2) h/o etoh abuse-discussed abstinence.   Plan 1) continue low fat diet, etoh avoidance, no other recs.  ok to fu in GI in 2-3 weeks as needed.   Electronic Signatures: Barnetta ChapelSkulskie, Martin (MD)  (Signed 29-May-14 11:51)  Authored: Chief Complaint, VITAL SIGNS/ANCILLARY NOTES, Brief Assessment, Lab Results, Assessment/Plan   Last Updated: 29-May-14 11:51 by Barnetta ChapelSkulskie, Martin (MD)

## 2014-09-04 NOTE — Consult Note (Signed)
Chief Complaint:  Subjective/Chief Complaint seen for pancreatitis.  Patient now passing flatus, minimal nausea, still on ice chips.   VITAL SIGNS/ANCILLARY NOTES: **Vital Signs.:   26-May-14 07:43  Temperature Temperature (F) 98.1  Celsius 36.7  Temperature Source oral  Pulse Pulse 108  Respirations Respirations 18  Systolic BP Systolic BP 364  Diastolic BP (mmHg) Diastolic BP (mmHg) 680  Mean BP 119  Pulse Ox % Pulse Ox % 96  Pulse Ox Activity Level  At rest  Oxygen Delivery 2L   Brief Assessment:  Cardiac Regular   Respiratory clear BS   Gastrointestinal details normal mild to moderate distension   Lab Results: Routine Micro:  25-May-14 09:25   Culture Comment NO GROWTH IN 18-24 HOURS  Result(s) reported on 07 Oct 2012 at 06:27AM.  Culture Comment NO GROWTH IN 18-24 HOURS  Result(s) reported on 07 Oct 2012 at 06:26AM.  Routine Chem:  23-May-14 01:34   Lipase  > 3000 (Result(s) reported on 04 Oct 2012 at 02:17AM.)    14:27   Lipase  > 3000 (Result(s) reported on 04 Oct 2012 at 03:31PM.)  24-May-14 01:04   Lipase  > 3000 (Result(s) reported on 05 Oct 2012 at 01:33AM.)    16:23   Lipase  2529 (Result(s) reported on 05 Oct 2012 at 04:53PM.)  25-May-14 04:54   Lipase  1230 (Result(s) reported on 06 Oct 2012 at 05:33AM.)  26-May-14 05:06   Glucose, Serum 86  BUN 10  Creatinine (comp) 0.82  Sodium, Serum 139  Potassium, Serum 3.7  Chloride, Serum  108  CO2, Serum 23  Calcium (Total), Serum  7.6  Anion Gap 8  Osmolality (calc) 276  eGFR (African American) >60  eGFR (Non-African American) >60 (eGFR values <22m/min/1.73 m2 may be an indication of chronic kidney disease (CKD). Calculated eGFR is useful in patients with stable renal function. The eGFR calculation will not be reliable in acutely ill patients when serum creatinine is changing rapidly. It is not useful in  patients on dialysis. The eGFR calculation may not be applicable to patients at the low and  high extremes of body sizes, pregnant women, and vegetarians.)  Lipase  594 (Result(s) reported on 07 Oct 2012 at 05:37AM.)  Routine Hem:  26-May-14 05:06   WBC (CBC)  12.6  RBC (CBC)  3.75  Hemoglobin (CBC)  12.4  Hematocrit (CBC)  35.0  Platelet Count (CBC) 176  MCV 93  MCH 33.2  MCHC 35.5  RDW 12.7  Neutrophil % 79.3  Lymphocyte % 9.7  Monocyte % 9.0  Eosinophil % 1.7  Basophil % 0.3  Neutrophil #  10.0  Lymphocyte # 1.2  Monocyte #  1.1  Eosinophil # 0.2  Basophil # 0.0 (Result(s) reported on 07 Oct 2012 at 05:32AM.)   Radiology Results: CT:    25-May-14 08:04, CT Chest Abdomen and Pelvis WO  CT Chest Abdomen and Pelvis WO   REASON FOR EXAM:    (1) hypoxia; (2) pancreatitis  COMMENTS:       PROCEDURE: CT  - CT CHEST ABDOMEN AND PELVIS WO  - Oct 06 2012  8:04AM     RESULT: Comparison: CT of the abdomen and pelvis 10/04/2012    Technique: Multiple axial images obtained fromthe thoracic inlet to the   pubic symphysis, without p.o. contrast and without intravenous contrast.    Findings:  No mediastinal, hilar or, or axillary lymphadenopathy. There is a small   left pleural effusion and trace right pleural effusion. Consolidation and  volume loss in the left lower lobe, and to a lesser extent the right   lower lobe, are likely secondary to atelectasis.  The liver is diffusely low in attenuation, consistent with hepatic   steatosis. The spleen and adrenals are unremarkable. Relative increased   attenuation within the gallbladder may represent sludge. There is   moderate peripancreatic stranding, greatest along the body and tail of   the pancreas which is increased from prior. Mild to moderate fluid is   seen extending inferiorly in both the right and left hemiabdomen. No   discrete loculated fluid collection. There is a small amount of   perihepatic and perisplenic fluid.    No renal calculi or hydronephrosis. There is a mild amount of free fluid   in the pelvis.  This is new from prior. There is some increased   attenuation dependently within the fluid which may represent blood   products. The small and large bowel are normal in caliber. The appendix   is normal.  No aggressive lytic or sclerotic osseous lesions are identified.    IMPRESSION:   1. Moderate peripancreatic stranding is increased from prior. Small   amount of fluid within in the abdomen and pelvis is increased from prior.   Relative increased attenuation dependently within the fluid within the   pelvis may represent blood products.  2. Small left, and trace right, pleural effusions. Consolidation and   volume loss in the left lower lobe, and to a lesser extent the right   lower lobe, likely secondary to atelectasis.      Dictation Site: 8      Verified By: Gregor Hams, M.D., MD   Assessment/Plan:  Assessment/Plan:  Assessment 1) acute pancreatitis, likely etoh related.  Symptoms slowly improving.  labs much improved. 2) h/o etoh abuse   Plan 1) will trial restricted clears, counseled etoh cessation, continue current.   Electronic Signatures: Loistine Simas (MD)  (Signed 26-May-14 10:19)  Authored: Chief Complaint, VITAL SIGNS/ANCILLARY NOTES, Brief Assessment, Lab Results, Radiology Results, Assessment/Plan   Last Updated: 26-May-14 10:19 by Loistine Simas (MD)
